# Patient Record
Sex: Male | Born: 1990 | Race: Black or African American | Hispanic: No | Marital: Married | Smoking: Never smoker
Health system: Southern US, Community
[De-identification: ages and names within clinical notes are randomized; demographics above are authoritative.]

## PROBLEM LIST (undated history)

## (undated) DIAGNOSIS — I10 Essential (primary) hypertension: Secondary | ICD-10-CM

## (undated) HISTORY — PX: PEG TUBE PLACEMENT: SUR1034

---

## 2020-07-17 ENCOUNTER — Emergency Department (HOSPITAL_COMMUNITY)
Admission: EM | Admit: 2020-07-17 | Discharge: 2020-07-17 | Disposition: A | Discharge status: Home / Self Care | Payer: Commercial Managed Care - PPO | Attending: Emergency Medicine | Admitting: Emergency Medicine

## 2020-07-17 ENCOUNTER — Emergency Department (HOSPITAL_COMMUNITY): Payer: Commercial Managed Care - PPO

## 2020-07-17 ENCOUNTER — Encounter (HOSPITAL_COMMUNITY): Payer: Self-pay

## 2020-07-17 DIAGNOSIS — J209 Acute bronchitis, unspecified: Secondary | ICD-10-CM

## 2020-07-17 DIAGNOSIS — R093 Abnormal sputum: Secondary | ICD-10-CM | POA: Diagnosis present

## 2020-07-17 DIAGNOSIS — Z79899 Other long term (current) drug therapy: Secondary | ICD-10-CM | POA: Insufficient documentation

## 2020-07-17 DIAGNOSIS — I10 Essential (primary) hypertension: Secondary | ICD-10-CM | POA: Insufficient documentation

## 2020-07-17 HISTORY — DX: Essential (primary) hypertension: I10

## 2020-07-17 MED ORDER — DOXYCYCLINE HYCLATE 100 MG PO CAPS: 100.0000 mg | ORAL_CAPSULE | Freq: Two times a day (BID) | ORAL | 0 refills | Status: AC

## 2020-07-17 NOTE — Discharge Instructions (Addendum)
Take doxycycline twice a day for the next 7 days, follow-up with your doctor in 1 week if no better, continue DayQuil and NyQuil as needed

## 2020-07-17 NOTE — ED Provider Notes (Signed)
[name] EMERGENCY DEPARTMENT Provider Note   CSN: [id number] Arrival date & time: 07/17/20  1900     History Chief Complaint  Patient presents with  . Cough    Andre Kane is a 30 y.o. male.  HPI   This patient is a 30 year old male, he has a history of hypertension on amlodipine and lisinopril.  The patient reports that for about 10 days he has had runny nose sore throat and a progressive cough that is now productive of yellow phlegm.  He has not had any fevers or chills and he denies nausea vomiting or diarrhea.  He has no pain in his chest and does not use oxygen nor does he have reactive airway disease or prior lung abnormalities.  He reports that his roommate has had similar symptoms and went to the doctor, told he had pneumonia and given an antibiotic and he was thinking he may need the same.  He took 2 at home COVID test which were negative a couple of days ago.  His symptoms are persistent, they seem to be gradually worsening, they are not associated with gastrointestinal upset  Past Medical History:  Diagnosis Date  . Hypertension     There are no problems to display for this patient.   History reviewed. No pertinent surgical history.     No family history on file.     Home Medications Prior to Admission medications   Medication Sig Start Date End Date Taking? Authorizing Provider  doxycycline (VIBRAMYCIN) 100 MG capsule Take 1 capsule (100 mg total) by mouth 2 (two) times daily for 7 days. 07/17/20 07/24/20 Yes Eber Hong, MD    Allergies    Patient has no known allergies.  Review of Systems   Review of Systems  Constitutional: Negative for fever.  HENT: Positive for postnasal drip, rhinorrhea and sore throat. Negative for ear pain.   Respiratory: Positive for cough. Negative for shortness of breath.   Cardiovascular: Negative for chest pain and leg swelling.  Gastrointestinal: Negative for diarrhea, nausea and vomiting.   Skin: Negative for rash.    Physical Exam Updated Vital Signs BP (!) 136/96 (BP Location: Left Arm)   Pulse 77   Temp 97.7 F (36.5 C) (Oral)   Resp 16   SpO2 95%   Physical Exam Vitals and nursing note reviewed.  Constitutional:      General: He is not in acute distress.    Appearance: He is well-developed.  HENT:     Head: Normocephalic and atraumatic.     Nose: Congestion and rhinorrhea present.     Mouth/Throat:     Pharynx: Posterior oropharyngeal erythema present. No oropharyngeal exudate.  Eyes:     General: No scleral icterus.       Right eye: No discharge.        Left eye: No discharge.     Conjunctiva/sclera: Conjunctivae normal.     Pupils: Pupils are equal, round, and reactive to light.  Neck:     Thyroid: No thyromegaly.     Vascular: No JVD.  Cardiovascular:     Rate and Rhythm: Normal rate and regular rhythm.     Heart sounds: Normal heart sounds. No murmur heard. No friction rub. No gallop.   Pulmonary:     Effort: Pulmonary effort is normal. No respiratory distress.     Breath sounds: Normal breath sounds. No wheezing or rales.     Comments: Scattered rales in the left side that clear with  deep breathing and coughing Abdominal:     General: Bowel sounds are normal. There is no distension.     Palpations: Abdomen is soft. There is no mass.     Tenderness: There is no abdominal tenderness.  Musculoskeletal:        General: No tenderness. Normal range of motion.     Cervical back: Normal range of motion and neck supple.  Lymphadenopathy:     Cervical: No cervical adenopathy.  Skin:    General: Skin is warm and dry.     Findings: No erythema or rash.  Neurological:     Mental Status: He is alert.     Coordination: Coordination normal.  Psychiatric:        Behavior: Behavior normal.     ED Results / Procedures / Treatments   Labs (all labs ordered are listed, but only abnormal results are displayed) Labs Reviewed - No data to  display  EKG None  Radiology DG Chest 2 View  Result Date: 07/17/2020 CLINICAL DATA:  Cough.  Congestion.  Shortness of breath. EXAM: CHEST - 2 VIEW COMPARISON:  None. FINDINGS: Lateral view degraded by patient arm position. Midline trachea.  Normal heart size and mediastinal contours. Sharp costophrenic angles.  No pneumothorax.  Clear lungs. IMPRESSION: No active cardiopulmonary disease. Electronically Signed   By: Jeronimo Greaves M.D.   On: 07/17/2020 19:34    Procedures Procedures   Medications Ordered in ED Medications - No data to display  ED Course  I have reviewed the triage vital signs and the nursing notes.  Pertinent labs & imaging results that were available during my care of the patient were reviewed by me and considered in my medical decision making (see chart for details).    MDM Rules/Calculators/A&P                          Though his symptoms sound viral he has had symptoms for 10 days with progressive yellow sputum.  Vital signs are reassuring, no fever or tachycardia, will treat with a course of doxycycline, the patient is stable for discharge  I have reviewed and viewed his chest x-ray, I agree that there is no signs of acute infection or other long severe abnormalities.  The patient is agreeable to return should symptoms worsen  Final Clinical Impression(s) / ED Diagnoses Final diagnoses:  Acute bronchitis, unspecified organism    Rx / DC Orders ED Discharge Orders         Ordered    doxycycline (VIBRAMYCIN) 100 MG capsule  2 times daily        07/17/20 2142           Eber Hong, MD 07/17/20 2142

## 2020-07-17 NOTE — ED Triage Notes (Signed)
Pt report that for the past week he has had a cough and runny nose, coughing up yellow phlegm. Denies fevers.

## 2020-07-17 NOTE — ED Provider Notes (Signed)
Emergency Medicine Provider Triage Evaluation Note  Andre Kane 30 y.o. male was evaluated in triage.  Pt complains of cough, difficulty breathing, congestion, rhinorrhea.  He states this is been ongoing for about a week.  He states that cough is productive of phlegm.  He states his roommate has had similar symptoms and came today and said that he had pneumonia.  Patient has done at home test for COVID and they were negative.  He has been vaccinated for COVID.  Denies any abdominal pain, nausea/vomiting.  He does not smoke.   Review of Systems  Positive: Cough, difficulty breathing Negative: Fever, abdominal pain, nausea/vomiting.  Physical Exam  BP 134/82   Pulse 70   Temp 98.2 F (36.8 C) (Oral)   Resp 18   Ht 5\' 4"  (1.626 m)   Wt 65.8 kg   SpO2 100%   BMI 24.89 kg/m  Gen:   Awake, no distress   HEENT:  Atraumatic  Resp:  Normal effort.  No wheezing.  Able to speak in full sentences without any difficulty. Cardiac:  Normal rate  Abd:   Nondistended, nontender  MSK:   Moves extremities without difficulty  Neuro:  Speech clear   Other:      Medical Decision Making  Medically screening exam initiated at 7:11 PM  Appropriate orders placed.  Andre Kane was informed that the remainder of the evaluation will be completed by another provider, this initial triage assessment does not replace that evaluation, and the importance of remaining in the ED until their evaluation is complete.   Clinical Impression  Cough   Portions of this note were generated with Dragon dictation software. Dictation errors may occur despite best attempts at proofreading.     , PA-C 07/17/20 1912    09/16/20, MD 07/17/20 2138

## 2021-05-15 ENCOUNTER — Other Ambulatory Visit: Payer: Self-pay

## 2021-05-15 ENCOUNTER — Ambulatory Visit (INDEPENDENT_AMBULATORY_CARE_PROVIDER_SITE_OTHER): Payer: BC Managed Care – PPO | Admitting: Neurology

## 2021-05-15 ENCOUNTER — Encounter: Payer: Self-pay | Admitting: Neurology

## 2021-05-15 VITALS — BP 143/88 | HR 90 | Ht 74.0 in | Wt 329.2 lb

## 2021-05-15 DIAGNOSIS — Z9189 Other specified personal risk factors, not elsewhere classified: Secondary | ICD-10-CM

## 2021-05-15 DIAGNOSIS — R0681 Apnea, not elsewhere classified: Secondary | ICD-10-CM | POA: Diagnosis not present

## 2021-05-15 DIAGNOSIS — G4719 Other hypersomnia: Secondary | ICD-10-CM | POA: Diagnosis not present

## 2021-05-15 DIAGNOSIS — R030 Elevated blood-pressure reading, without diagnosis of hypertension: Secondary | ICD-10-CM | POA: Diagnosis not present

## 2021-05-15 DIAGNOSIS — R0683 Snoring: Secondary | ICD-10-CM

## 2021-05-15 DIAGNOSIS — Z820 Family history of epilepsy and other diseases of the nervous system: Secondary | ICD-10-CM

## 2021-05-15 DIAGNOSIS — R351 Nocturia: Secondary | ICD-10-CM

## 2021-05-15 DIAGNOSIS — J351 Hypertrophy of tonsils: Secondary | ICD-10-CM

## 2021-05-15 NOTE — Patient Instructions (Signed)

## 2021-05-15 NOTE — Progress Notes (Signed)
Subjective:    Patient ID: Andre Kane is a 31 y.o. male.  HPI    Huston Foley, MD, PhD [facility] [address], [address] [address] [place], [place] [address]  Dear [name],   I saw your patient, Andre Kane, requested my sleep clinic today for consultation of his sleep disorder, in particular concern for obstructive sleep apnea.  The patient is accompanied by his wife and young child today. As you know, Andre Kane is a 31 year old right-handed gentleman with an underlying medical history of hypertension, diabetes and severe obesity with a BMI of over 40, who reports snoring and excessive daytime somnolence for the past few years, his symptoms have become worse in the past 6 to 8 months per wife's report.  She has noted long pauses and loud snoring while he is asleep and he has woken up with a sense of gasping for air.  She also reports that he sleeps all the time.  When he is not working he is essentially sleeping and only wakes up to eat or go to the bathroom.  Symptoms have improved just a little bit recently when he was diagnosed with diabetes and has started making some lifestyle changes.  Previously he used to drink 10 bottles of soda and 4-5 bottles of energy drink per day and now has limited himself to diet soda 1 or 2 bottles per day and 2 sugar-free energy drinks per day.  He is a non-smoker and does not drink alcohol.  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  He goes to bed generally around 9 AM but may sleep off and on until 10 PM when he has to get up and get ready for work.  He lives with his family including wife and 6 children, they have 1 dog in the household.  They do have a TV in the bedroom and he has a tendency to leave it on when he is sleeping in his bedroom.  He had a sleep study some 8 years ago.  He estimates that he was at least 100 pounds lighter than.  He did not have a sleep apnea  diagnosis then.  He recalls doing a home sleep test and was not advised to use CPAP therapy.  Epworth sleepiness score is 17 out of 24, fatigue severity score is 43 out of 63.  He has not fallen asleep at the wheel but does feel tired a lot.   He denies recurrent morning headaches but has nocturia about 4-5 times per average night or day.  His maternal uncle has sleep apnea and has a CPAP machine.  Patient would consider CPAP therapy.  He is weight loss.  His Past Medical History Is Significant For: Past Medical History:  Diagnosis Date   Diabetes (HCC)    Hypertension     His Past Surgical History Is Significant For: Past Surgical History:  Procedure Laterality Date   PEG TUBE PLACEMENT      His Family History Is Significant For: Family History  Problem Relation Age of Onset   Sleep apnea Maternal Uncle    Diabetes Maternal Grandmother    Heart disease Maternal Grandmother    Hyperlipidemia Maternal Grandmother     His Social History Is Significant For: Social History   Socioeconomic History   Marital status: Married    Spouse name: Not on file   Number of children: Not on file   Years of education:  Not on file   Highest education level: Not on file  Occupational History   Not on file  Tobacco Use   Smoking status: Never   Smokeless tobacco: Never  Vaping Use   Vaping Use: Never used  Substance and Sexual Activity   Alcohol use: Not Currently   Drug use: Not on file   Sexual activity: Not on file  Other Topics Concern   Not on file  Social History Narrative   Not on file   Social Determinants of Health   Financial Resource Strain: Not on file  Food Insecurity: Not on file  Transportation Needs: Not on file  Physical Activity: Not on file  Stress: Not on file  Social Connections: Not on file    His Allergies Are:  No Known Allergies:   His Current Medications Are:  Outpatient Encounter Medications as of 05/15/2021  Medication Sig   amLODipine (NORVASC)  10 MG tablet Take by mouth.   FLUoxetine (PROZAC) 20 MG tablet Take 20 mg by mouth daily.   hydrOXYzine (ATARAX) 25 MG tablet Take 25 mg by mouth 3 (three) times daily as needed.   lisinopril (ZESTRIL) 40 MG tablet Take 40 mg by mouth daily.   METFORMIN & DIET MANAGE PROD PO    risperiDONE (RISPERDAL) 1 MG tablet Take 1 mg by mouth at bedtime.   simvastatin (ZOCOR) 20 MG tablet Take 20 mg by mouth daily.   traZODone (DESYREL) 50 MG tablet Take 50 mg by mouth at bedtime.   No facility-administered encounter medications on file as of 05/15/2021.  :   Review of Systems:  Out of a complete 14 point review of systems, all are reviewed and negative with the exception of these symptoms as listed below:  Review of Systems  Neurological:        Pt is here sleep consult  Pt states he snores ,has fatigue , and stops breathing during the night, and has hypertension . Pt denies headaches. Pt states he had a sleep study 8 years ago but no CPAP was given   ESS:17 FSS:43   Objective:  Neurological Exam  Physical Exam Physical Examination:   Vitals:   05/15/21 1015  BP: (!) 143/88  Pulse: 90    General Examination: The patient is a very pleasant 31 y.o. male in no acute distress. He appears well-developed and well-nourished and well groomed.   HEENT: Normocephalic, atraumatic, pupils are equal, round and reactive to light, extraocular tracking is good without limitation to gaze excursion or nystagmus noted. Hearing is grossly intact. Face is symmetric with normal facial animation. Speech is clear with no dysarthria noted. There is no hypophonia. There is no lip, neck/head, jaw or voice tremor. Neck is supple with full range of passive and active motion. There are no carotid bruits on auscultation. Oropharynx exam reveals: mild mouth dryness, adequate dental hygiene and marked airway crowding, due to wider tongue, thicker soft palate, prominent uvula and tonsillar size of about 3+, Mallampati class  III.  Neck circumference of 19-1/2 inches.  Minimal overbite.  Nasal inspection reveals crowded nasal passages secondary to inferior turbinate hypertrophy and slight deviation of the septum to the left.  He has noisy and mouth breathing.  Tongue protrudes centrally and palate elevates symmetrically.  Chest: Clear to auscultation without wheezing, rhonchi or crackles noted.  Heart: S1+S2+0, regular and normal without murmurs, rubs or gallops noted.   Abdomen: Soft, non-tender and non-distended.  Extremities: There is no pitting edema in the distal  lower extremities bilaterally.   Skin: Warm and dry without trophic changes noted.   Musculoskeletal: exam reveals no obvious joint deformities, tenderness or joint swelling or erythema.   Neurologically:  Mental status: The patient is awake, alert and oriented in all 4 spheres. His immediate and remote memory, attention, language skills and fund of knowledge are appropriate. There is no evidence of aphasia, agnosia, apraxia or anomia. Speech is clear with normal prosody and enunciation. Thought process is linear. Mood is normal and affect is normal.  Cranial nerves II - XII are as described above under HEENT exam.  Motor exam: Normal bulk, strength and tone is noted. There is no tremor, grossly intact.  Cerebellar testing: No dysmetria or intention tremor. There is no truncal or gait ataxia.  Sensory exam: intact to light touch in the upper and lower extremities.  Gait, station and balance: He stands easily. No veering to one side is noted. No leaning to one side is noted. Posture is age-appropriate and stance is narrow based. Gait shows normal stride length and normal pace. No problems turning are noted.   Assessment and Plan:   In summary, Andre Kane is a very pleasant 31 y.o.-year old male with an underlying medical history of hypertension, diabetes and severe obesity with a BMI of over 40, whose history and physical exam are  concerning for obstructive sleep apnea (OSA). I had a long chat with the patient and his wife about my findings and the diagnosis of OSA, its prognosis and treatment options. We talked about medical treatments, surgical interventions and non-pharmacological approaches. I explained in particular the risks and ramifications of untreated moderate to severe OSA, especially with respect to developing cardiovascular disease down the Road, including congestive heart failure, difficult to treat hypertension, cardiac arrhythmias, or stroke. Even type 2 diabetes has, in part, been linked to untreated OSA. Symptoms of untreated OSA include daytime sleepiness, memory problems, mood irritability and mood disorder such as depression and anxiety, lack of energy, as well as recurrent headaches, especially morning headaches. We talked about trying to maintain a healthy lifestyle in general, as well as the importance of weight control. We also talked about the importance of good sleep hygiene. I recommended the following at this time: sleep study.  I explained the differences between a laboratory attended sleep study versus home sleep test. I explained the sleep test procedure to the patient and also outlined possible surgical and non-surgical treatment options of OSA, including the use of a custom-made dental device (which would require a referral to a specialist dentist or oral surgeon), upper airway surgical options, such as traditional UPPP or a novel less invasive surgical option in the form of Inspire hypoglossal nerve stimulation (which would involve a referral to an ENT surgeon). I also explained the CPAP treatment option to the patient, who indicated that he would be willing to try CPAP if the need arises. I explained the importance of being compliant with PAP treatment, not only for insurance purposes but primarily to improve His symptoms, and for the patient's long term health benefit, including to reduce His  cardiovascular risks. I answered all their questions today and the patient and his wife were in agreement.  We will keep him posted as to his test results by phone call and plan to follow-up after testing.  Thank you very much for allowing me to participate in the care of this nice patient. If I can be of any further assistance to you please do not hesitate  to call me at [phone].  Sincerely,   Huston Foley, MD, PhD

## 2021-05-29 ENCOUNTER — Telehealth: Payer: Self-pay

## 2021-05-29 NOTE — Telephone Encounter (Signed)
LVM for pt to call me back to schedule sleep study  

## 2021-06-23 ENCOUNTER — Telehealth: Payer: Self-pay

## 2021-06-23 NOTE — Telephone Encounter (Signed)
Called pt to see if he was interested in rescheduling his sleep study since he no showed 06/22/21 ?

## 2021-07-14 ENCOUNTER — Ambulatory Visit (INDEPENDENT_AMBULATORY_CARE_PROVIDER_SITE_OTHER): Payer: BC Managed Care – PPO | Admitting: Neurology

## 2021-07-14 DIAGNOSIS — R0681 Apnea, not elsewhere classified: Secondary | ICD-10-CM

## 2021-07-14 DIAGNOSIS — G4719 Other hypersomnia: Secondary | ICD-10-CM

## 2021-07-14 DIAGNOSIS — R030 Elevated blood-pressure reading, without diagnosis of hypertension: Secondary | ICD-10-CM

## 2021-07-14 DIAGNOSIS — G4734 Idiopathic sleep related nonobstructive alveolar hypoventilation: Secondary | ICD-10-CM

## 2021-07-14 DIAGNOSIS — G4733 Obstructive sleep apnea (adult) (pediatric): Secondary | ICD-10-CM | POA: Diagnosis not present

## 2021-07-14 DIAGNOSIS — R351 Nocturia: Secondary | ICD-10-CM

## 2021-07-14 DIAGNOSIS — Z820 Family history of epilepsy and other diseases of the nervous system: Secondary | ICD-10-CM

## 2021-07-14 DIAGNOSIS — R0683 Snoring: Secondary | ICD-10-CM

## 2021-07-14 DIAGNOSIS — J351 Hypertrophy of tonsils: Secondary | ICD-10-CM

## 2021-07-14 DIAGNOSIS — Z9189 Other specified personal risk factors, not elsewhere classified: Secondary | ICD-10-CM

## 2021-07-16 NOTE — Progress Notes (Signed)
See procedure note.

## 2021-07-17 NOTE — Addendum Note (Signed)
Addended by: Huston Foley on: 07/17/2021 06:24 PM ? ? Modules accepted: Orders ? ?

## 2021-07-17 NOTE — Procedures (Signed)
? ?[facility] ? ?HOME SLEEP TEST (Watch PAT) REPORT ? ?STUDY DATE: 07/14/2021 ? ?DOB: [birth year] ? ?MRN: [id number] ? ?ORDERING CLINICIAN: Huston Foley, MD, PhD ?  ?REFERRING CLINICIAN: [facility], [facility]  ? ?CLINICAL INFORMATION/HISTORY: 31 year old right-handed gentleman with an underlying medical history of hypertension, diabetes and severe obesity with a BMI of over 40, who reports snoring and excessive daytime somnolence for the past few years, his symptoms have become worse in the past 6 to 8 months per wife's report.  She has noted long pauses and loud snoring while he is asleep and he has woken up with a sense of gasping for air.  ? ?Epworth sleepiness score: 17/24. ? ?BMI: 42.2 kg/m? ? ?FINDINGS:  ? ?Sleep Summary:  ? ?Total Recording Time (hours, min): 6 hours, 44 minutes ? ?Total Sleep Time (hours, min):  5 hours, 41 minutes  ? ?Percent REM (%):    8.1%  ? ?Respiratory Indices:  ? ?Calculated pAHI (per hour):  79.4/hour        ? ?REM pAHI:    n/a      ? ?Oxygen Saturation Statistics:  ?  ?Oxygen Saturation (%) Mean: 86%  ? ?Minimum oxygen saturation (%):                 51%  ? ?O2 Saturation Range (%): 51-99%   ? ?O2 Saturation (minutes) <=88%: 168 min ? ?Pulse Rate Statistics:  ? ?Pulse Mean (bpm):    68/min   ? ?Pulse Range (33-125/min)  ? ?IMPRESSION: OSA (obstructive sleep apnea), severe ?Nocturnal hypoxemia ? ?RECOMMENDATION:  ?This home sleep test demonstrates rather severe obstructive sleep apnea with a total AHI of 79.4/h and O2 nadir of 51% with significant time below or at 88% saturation of over 2-1/2 hours for the night.  Moderate to loud snoring was detected throughout the night.  He had critical desaturations and evidence of nocturnal hypoxemia.  Treatment with positive airway pressure device is highly recommended and the patient will be advised to start AutoPap therapy at home.  A laboratory attended titration study can be considered in the future for  optimization of his treatment and better tolerance of therapy.  Alternative treatment options are limited secondary to the severity of the patient's sleep disordered breathing.  Concomitant weight loss is highly recommended and the patient will be advised to sleep on his sides and slightly elevated in the meantime while he is waiting to get his AutoPap machine.  Please note, that untreated obstructive sleep apnea may carry additional perioperative morbidity. Patients with significant obstructive sleep apnea should receive perioperative PAP therapy and the surgeons and particularly the anesthesiologist should be informed of the diagnosis and the severity of the sleep disordered breathing. ?The patient should be cautioned not to drive, work at heights, or operate dangerous or heavy equipment when tired or sleepy. Review and reiteration of good sleep hygiene measures should be pursued with any patient. ?Other causes of the patient's symptoms, including circadian rhythm disturbances, an underlying mood disorder, medication effect and/or an underlying medical problem cannot be ruled out based on this test. Clinical correlation is recommended. The patient and his referring provider will be notified of the test results. The patient will be seen in follow up in sleep clinic at [facility]. ? ?I certify that I have reviewed the raw data recording prior to the issuance of this report in accordance with the standards of the American Academy of Sleep Medicine (AASM). ? ?INTERPRETING PHYSICIAN:  ? ?Rawlins Stuard  Frances Furbish, MD, PhD  ?Board Certified in Neurology and Sleep Medicine ? ?[facility] ?[address], [address] ?[place], [place] [address] ?([phone] ? ? ? ? ? ? ? ? ? ? ? ? ? ? ? ? ?

## 2021-07-21 ENCOUNTER — Telehealth: Payer: Self-pay | Admitting: *Deleted

## 2021-07-21 NOTE — Telephone Encounter (Signed)
Spoke with patient and discussed the results of the sleep study and recommendations as noted below by Dr. Frances Furbish.  Patient understands his home sleep test showed obstructive sleep apnea in the very severe range with critical desaturations.  The patient has been advised to sleep with his head elevated about 30 degrees high and to also try to sleep on his sides while waiting for his AutoPap machine.  The patient is amenable to using AutoPap.  He does not have a preference of DME company he states he is located here in [place].  We will use [facility]. The patient understands insurance will require him to use the machine at least 4 hours at night and also be seen in the office between 30 and 90 days after setup. Pt's questions were answered. Pt was scheduled for initial f/u on October 06, 2021 at 9:00 AM arrival 8:30 AM. He verbalized appreciation for the call. ? ?Sleep study result, office note, insurance info all faxed to [facility] requesting urgent setup. Received a receipt of confirmation. ? ?

## 2021-07-21 NOTE — Telephone Encounter (Signed)
-----   Message from Huston Foley, MD sent at 07/17/2021  6:24 PM EDT ----- ?Urgent request, please expedite set up due to critical sleep apnea. ?Patient referred by primary care nurse practitioner, seen by me on 05/15/2021, patient had a HST on 07/14/2021.   ? ?Please call and notify the patient that the recent home sleep test showed obstructive sleep apnea in the very severe range with critical desaturations.  I would like for him to sleep elevated with his head of bed about 30 degrees and try to sleep on his sides while he is waiting for his AutoPap machine. I highly recommend treatment in the form of autoPAP, which means, that we don't have to bring him in for a sleep study with CPAP, but will let him start using a so called autoPAP machine at home, through a DME company (of his choice, or as per insurance requirement). The DME representative will fit the patient with a mask of choice, educate him on how to use the machine, how to put the mask on, etc. I have placed an order in the chart. Please send the order to a local DME, talk to patient, send report to referring MD. Please also reinforce the need for compliance with treatment. We will need a FU in sleep clinic for 10 weeks post-PAP set up, please arrange that with me or one of our NPs.  Please advise patient to continue to work hard on weight loss.  He can talk to his primary care about his options for weight loss, such as referral to a weight management clinic or bariatric clinic.  Thanks,  ? ?Huston Foley, MD, PhD ?[facility]) ? ? ? ? ?

## 2021-10-02 NOTE — Progress Notes (Deleted)
PATIENT: Andre Kane DOB: 1991-02-26  REASON FOR VISIT: follow up HISTORY FROM: patient  No chief complaint on file.    HISTORY OF PRESENT ILLNESS:  10/02/21 ALL:  Andre Kane is a 31 y.o. male here today for follow up for OSA on CPAP. He was seen in consult with Dr Frances Furbish 05/2021 for witnessed apneic events. HST 07/2021 showed severe OSA with total AHI 79.4/hr and O2 nadir of 51% with time below 88% over 2.5 hours. AutoPAP ordered.     HISTORY: (copied from Dr Teofilo Pod previous note)  Dear Andre Kane,   I saw your patient, Andre Golinski, requested my sleep clinic today for consultation of his sleep disorder, in particular concern for obstructive sleep apnea.  The patient is accompanied by his wife and young child today. As you know, Andre Kane is a 31 year old right-handed gentleman with an underlying medical history of hypertension, diabetes and severe obesity with a BMI of over 40, who reports snoring and excessive daytime somnolence for the past few years, his symptoms have become worse in the past 6 to 8 months per wife's report.  She has noted long pauses and loud snoring while he is asleep and he has woken up with a sense of gasping for air.  She also reports that he sleeps all the time.  When he is not working he is essentially sleeping and only wakes up to eat or go to the bathroom.  Symptoms have improved just a little bit recently when he was diagnosed with diabetes and has started making some lifestyle changes.  Previously he used to drink 10 bottles of soda and 4-5 bottles of energy drink per day and now has limited himself to diet soda 1 or 2 bottles per day and 2 sugar-free energy drinks per day.  He is a non-smoker and does not drink alcohol.  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  He goes to bed generally around 9 AM but may sleep off and on until 10 PM when he has to get up and get ready for work.  He lives  with his family including wife and 6 children, they have 1 dog in the household.  They do have a TV in the bedroom and he has a tendency to leave it on when he is sleeping in his bedroom.  He had a sleep study some 8 years ago.  He estimates that he was at least 100 pounds lighter than.  He did not have a sleep apnea diagnosis then.  He recalls doing a home sleep test and was not advised to use CPAP therapy.  Epworth sleepiness score is 17 out of 24, fatigue severity score is 43 out of 63.  He has not fallen asleep at the wheel but does feel tired a lot.   He denies recurrent morning headaches but has nocturia about 4-5 times per average night or day.  His maternal uncle has sleep apnea and has a CPAP machine.  Patient would consider CPAP therapy.  He is weight loss.   REVIEW OF SYSTEMS: Out of a complete 14 system review of symptoms, the patient complains only of the following symptoms, and all other reviewed systems are negative.  ESS:  ALLERGIES: No Known Allergies  HOME MEDICATIONS: Outpatient Medications Prior to Visit  Medication Sig Dispense Refill   amLODipine (NORVASC) 10 MG tablet Take by mouth.     FLUoxetine (PROZAC) 20 MG tablet Take 20 mg  by mouth daily.     hydrOXYzine (ATARAX) 25 MG tablet Take 25 mg by mouth 3 (three) times daily as needed.     lisinopril (ZESTRIL) 40 MG tablet Take 40 mg by mouth daily.     METFORMIN & DIET MANAGE PROD PO      risperiDONE (RISPERDAL) 1 MG tablet Take 1 mg by mouth at bedtime.     simvastatin (ZOCOR) 20 MG tablet Take 20 mg by mouth daily.     traZODone (DESYREL) 50 MG tablet Take 50 mg by mouth at bedtime.     No facility-administered medications prior to visit.    PAST MEDICAL HISTORY: Past Medical History:  Diagnosis Date   Diabetes (HCC)    Hypertension     PAST SURGICAL HISTORY: Past Surgical History:  Procedure Laterality Date   PEG TUBE PLACEMENT      FAMILY HISTORY: Family History  Problem Relation Age of Onset    Sleep apnea Maternal Uncle    Diabetes Maternal Grandmother    Heart disease Maternal Grandmother    Hyperlipidemia Maternal Grandmother     SOCIAL HISTORY: Social History   Socioeconomic History   Marital status: Married    Spouse name: Not on file   Number of children: Not on file   Years of education: Not on file   Highest education level: Not on file  Occupational History   Not on file  Tobacco Use   Smoking status: Never   Smokeless tobacco: Never  Vaping Use   Vaping Use: Never used  Substance and Sexual Activity   Alcohol use: Not Currently   Drug use: Not on file   Sexual activity: Not on file  Other Topics Concern   Not on file  Social History Narrative   Not on file   Social Determinants of Health   Financial Resource Strain: Not on file  Food Insecurity: Not on file  Transportation Needs: Not on file  Physical Activity: Not on file  Stress: Not on file  Social Connections: Not on file  Intimate Partner Violence: Not on file     PHYSICAL EXAM  There were no vitals filed for this visit. There is no height or weight on file to calculate BMI.  Generalized: Well developed, in no acute distress  Cardiology: normal rate and rhythm, no murmur noted Respiratory: clear to auscultation bilaterally  Neurological examination  Mentation: Alert oriented to time, place, history taking. Follows all commands speech and language fluent Cranial nerve II-XII: Pupils were equal round reactive to light. Extraocular movements were full, visual field were full  Motor: The motor testing reveals 5 over 5 strength of all 4 extremities. Good symmetric motor tone is noted throughout.  Gait and station: Gait is normal.    DIAGNOSTIC DATA (LABS, IMAGING, TESTING) - I reviewed patient records, labs, notes, testing and imaging myself where available.      No data to display           No results found for: "WBC", "HGB", "HCT", "MCV", "PLT" No results found for: "NA", "K",  "CL", "CO2", "GLUCOSE", "BUN", "CREATININE", "CALCIUM", "PROT", "ALBUMIN", "AST", "ALT", "ALKPHOS", "BILITOT", "GFRNONAA", "GFRAA" No results found for: "CHOL", "HDL", "LDLCALC", "LDLDIRECT", "TRIG", "CHOLHDL" No results found for: "HGBA1C" No results found for: "VITAMINB12" No results found for: "TSH"   ASSESSMENT AND PLAN 31 y.o. year old male  has a past medical history of Diabetes (HCC) and Hypertension. here with   No diagnosis found.    Andre Loyola is  doing well on CPAP therapy. Compliance report reveals ***. *** was encouraged to continue using CPAP nightly and for greater than 4 hours each night. We will update supply orders as indicated. Risks of untreated sleep apnea review and education materials provided. Healthy lifestyle habits encouraged. *** will follow up in ***, sooner if needed. *** verbalizes understanding and agreement with this plan.    No orders of the defined types were placed in this encounter.    No orders of the defined types were placed in this encounter.     Shawnie Dapper, FNP-C 10/02/2021, 4:31 PM [facility] [address], [address] [place], [place] [address] [phone]

## 2021-10-06 ENCOUNTER — Ambulatory Visit: Payer: BC Managed Care – PPO | Admitting: Family Medicine

## 2021-10-06 ENCOUNTER — Encounter: Payer: Self-pay | Admitting: Family Medicine

## 2021-10-06 DIAGNOSIS — G4733 Obstructive sleep apnea (adult) (pediatric): Secondary | ICD-10-CM

## 2022-06-19 IMAGING — DX DG CHEST 2V
2 series · 2 of 2 positions shown · non-contrast
Comparison: None.

CLINICAL DATA: Cough.  Congestion.  Shortness of breath.

EXAM:
CHEST - 2 VIEW

[w chest pa]
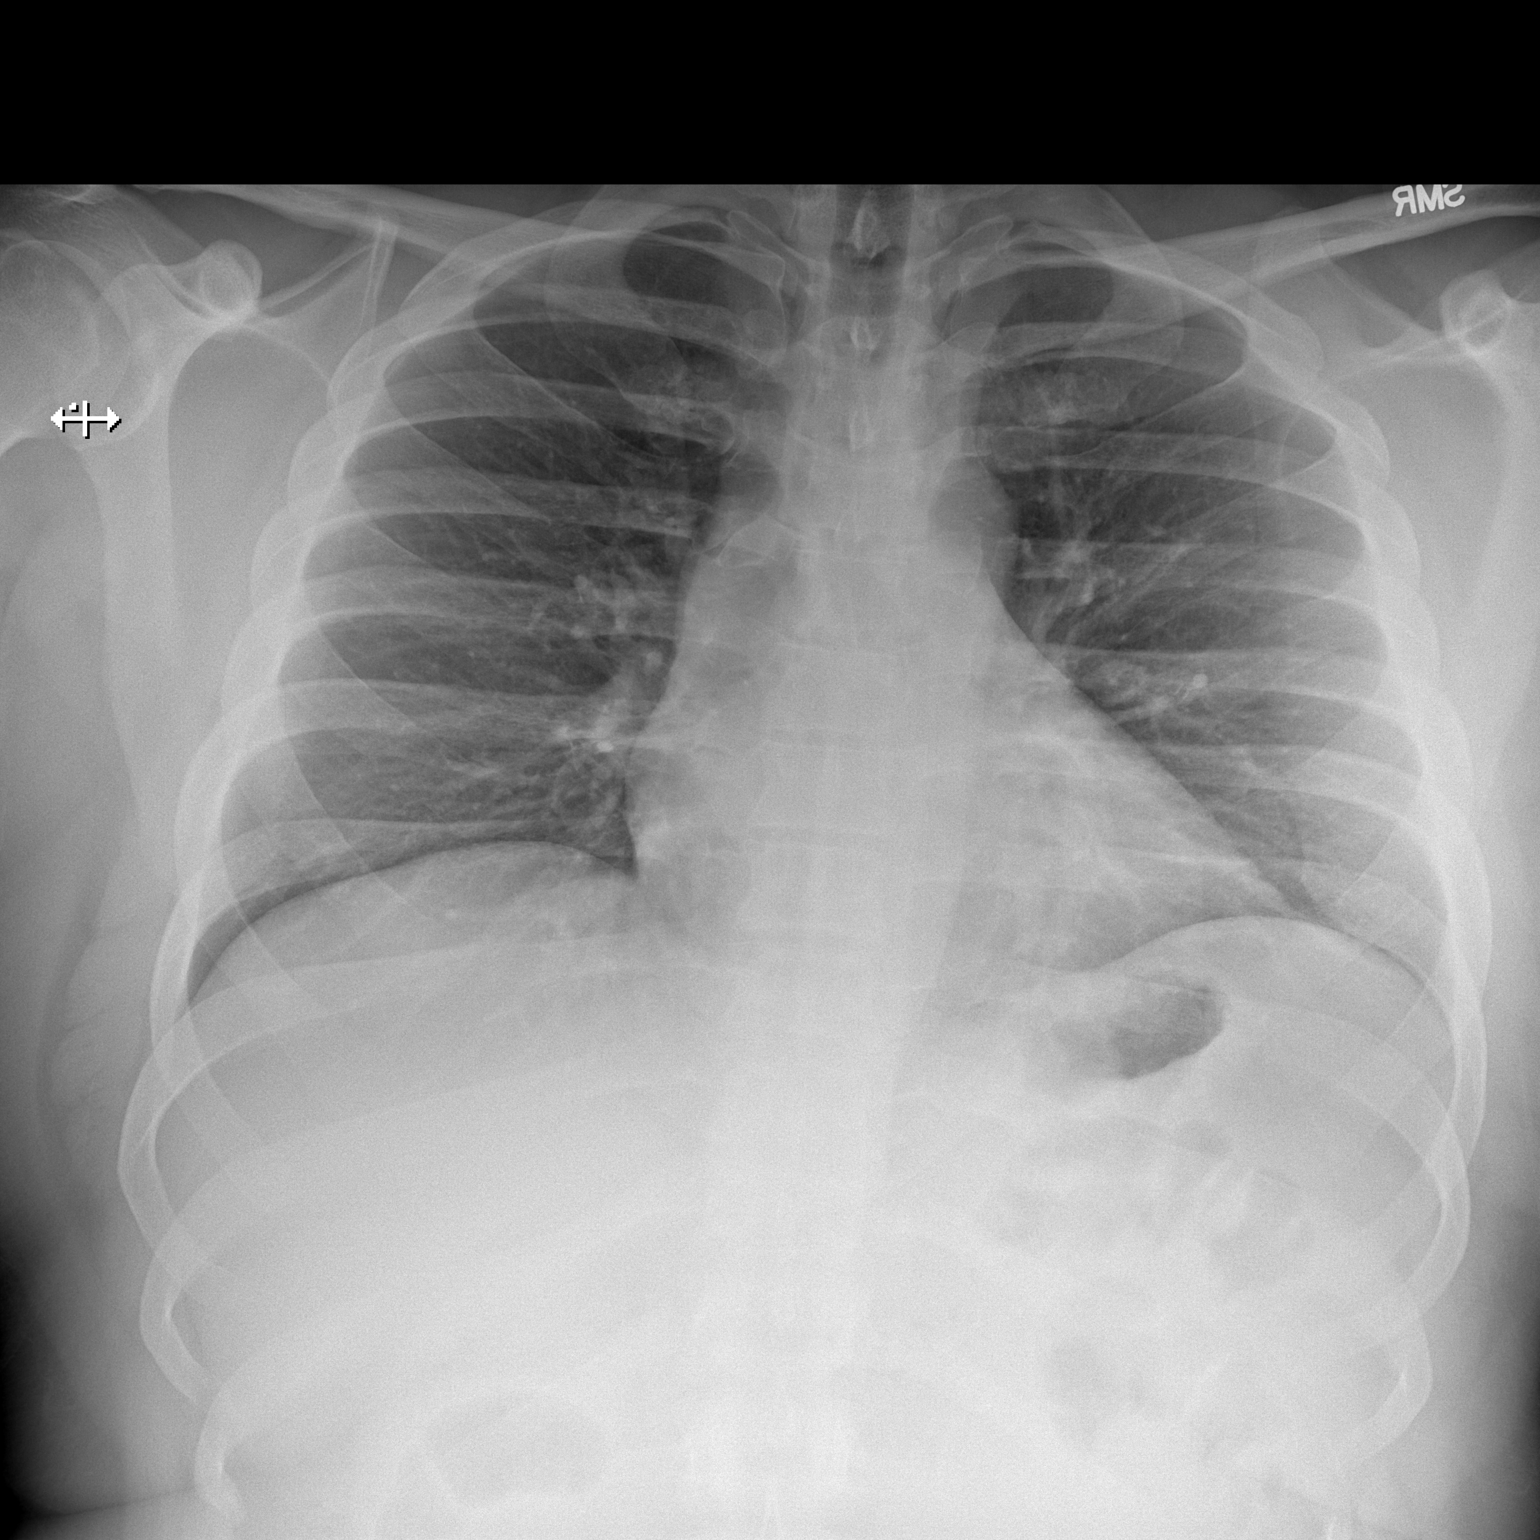

[w chest lat]
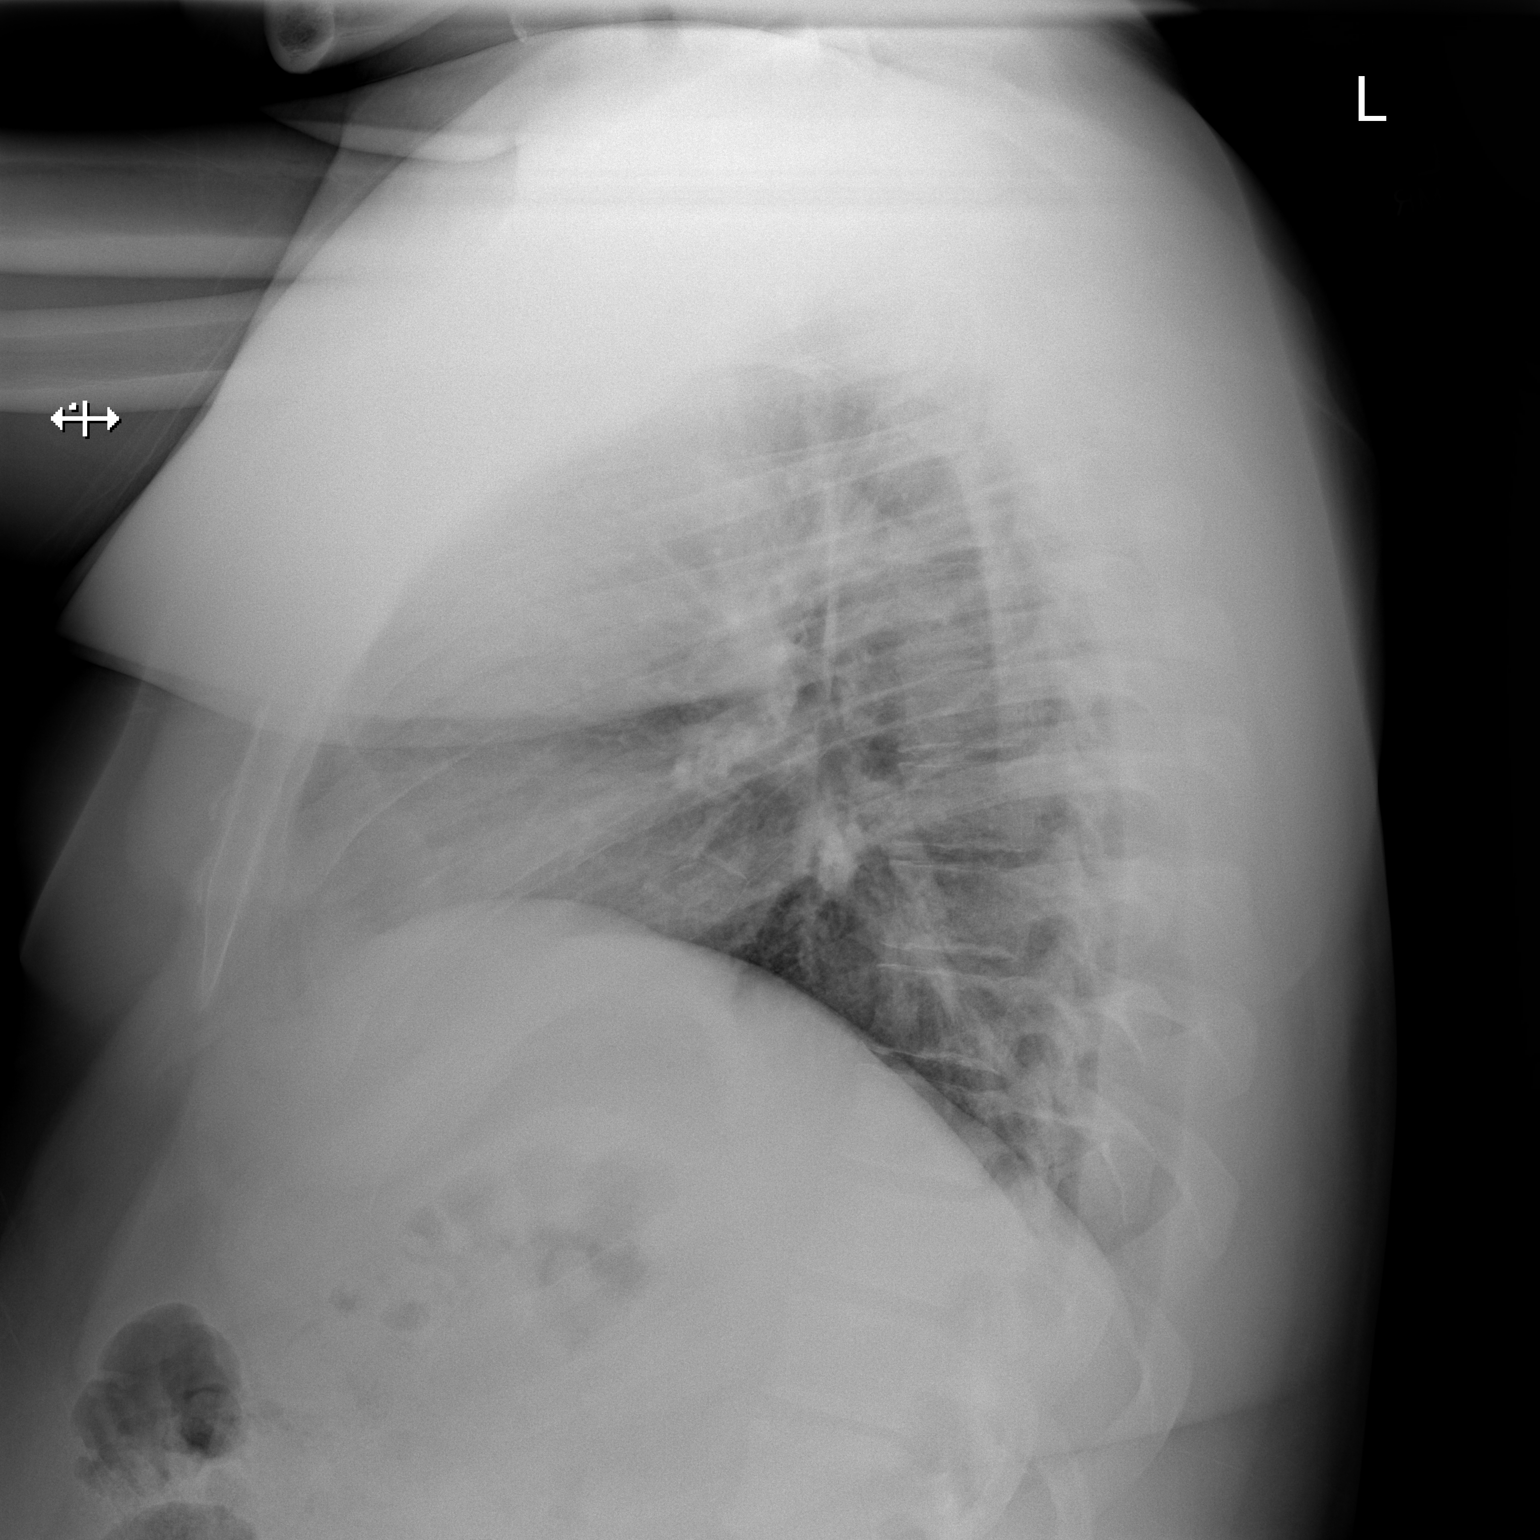

[2 of 2 positions shown; findings below may reference images not displayed]

FINDINGS: Lateral view degraded by patient arm position.

Midline trachea.  Normal heart size and mediastinal contours.

Sharp costophrenic angles.  No pneumothorax.  Clear lungs.
IMPRESSION: No active cardiopulmonary disease.
